# Patient Record
Sex: Male | Born: 2000 | Race: White | Hispanic: No | Marital: Single | Smoking: Never smoker
Health system: Southern US, Community
[De-identification: ages and names within clinical notes are randomized; demographics above are authoritative.]

---
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
## 2019-01-07 IMAGING — MR MR ANKLE*R* W/O CM
4 of 5 series · 23 of 40 positions shown · non-contrast
Comparison: 07/12/2016

CLINICAL DATA: Right lateral ankle pain. Twisting injury playing
soccer 2 months prior.

EXAM:
MRI OF THE RIGHT ANKLE WITHOUT CONTRAST
TECHNIQUE: Multiplanar, multisequence MR imaging of the ankle was performed. No
intravenous contrast was administered.

[Series 3: PD fat-sat · axial · 3.5mm · 0.31mm/px · z∈[-64,+58]mm · 9 of 29 slices shown]
[im 1/29]
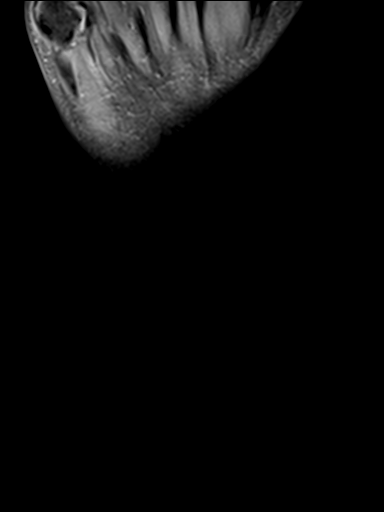
[im 4/29]
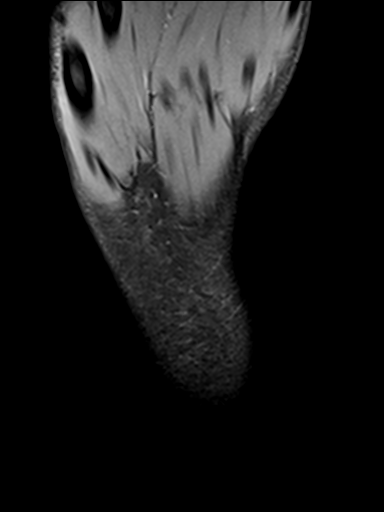
[im 8/29]
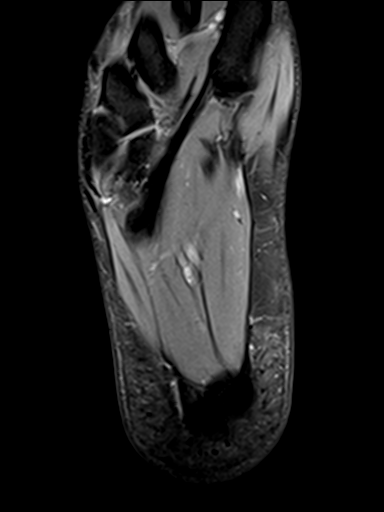
[im 11/29]
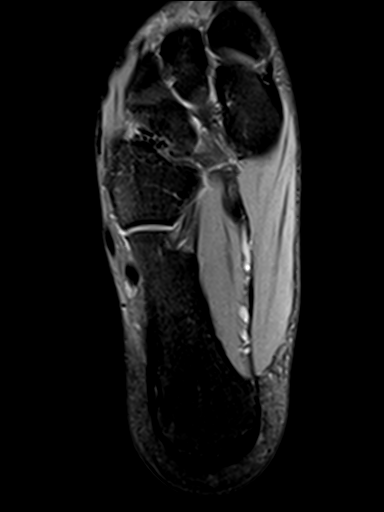
[im 15/29]
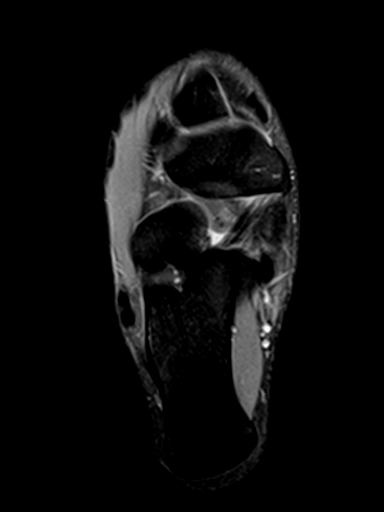
[im 18/29]
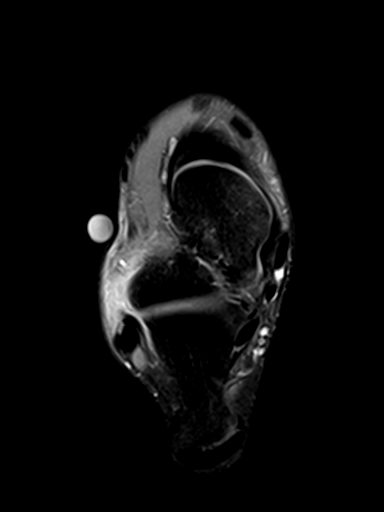
[im 22/29]
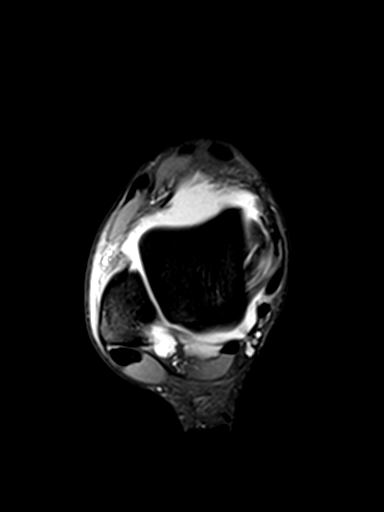
[im 25/29]
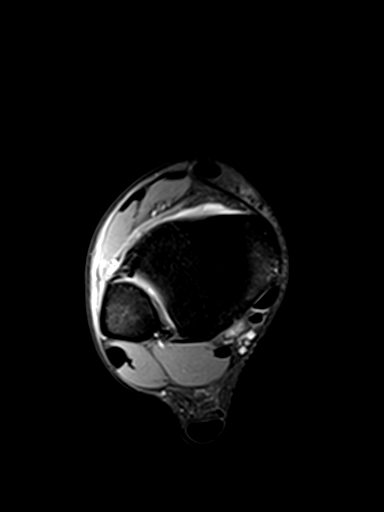
[im 29/29]
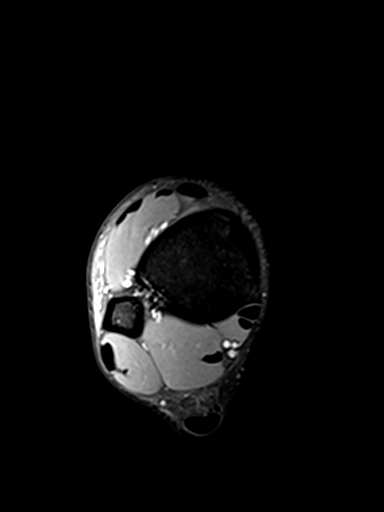

[Series 4: T2 fat-sat · axial · 3.5mm · 0.31mm/px · z∈[-64,+41]mm · 8 of 29 slices shown (1 of 3)]
[im 1/29]
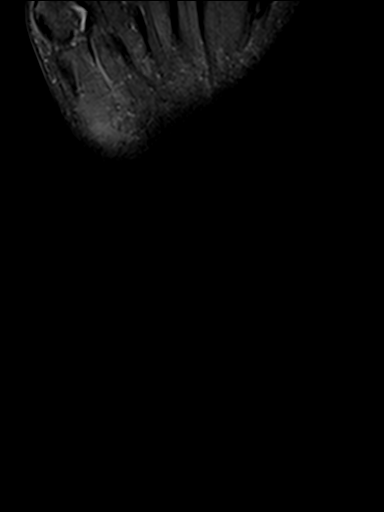
[im 4/29]
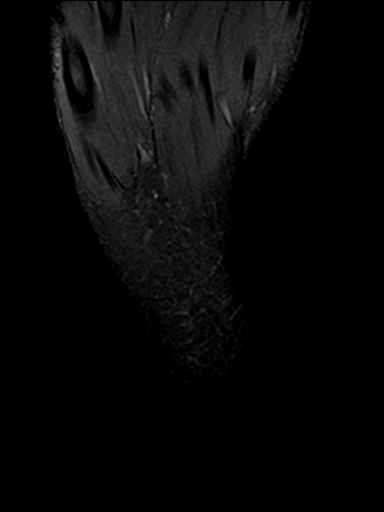
[im 8/29]
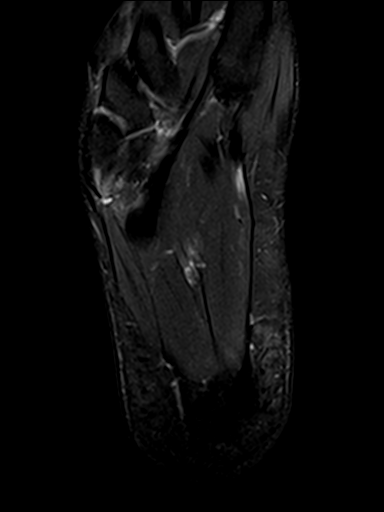
[im 11/29]
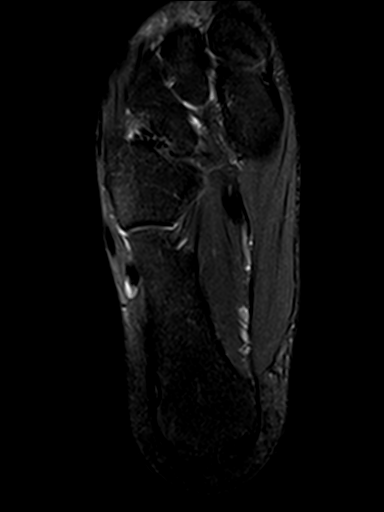
[im 15/29]
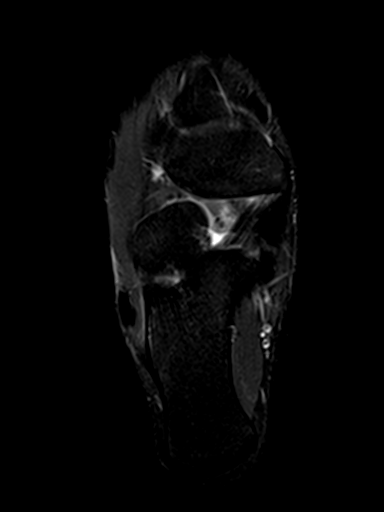
[im 18/29]
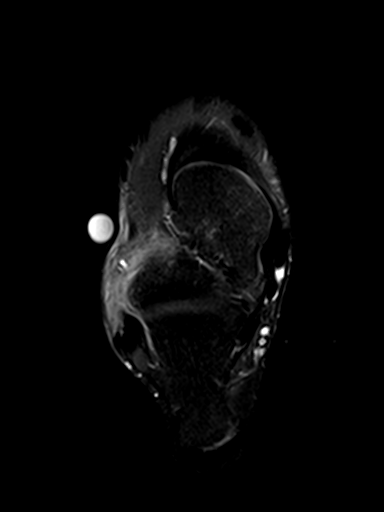
[im 22/29]
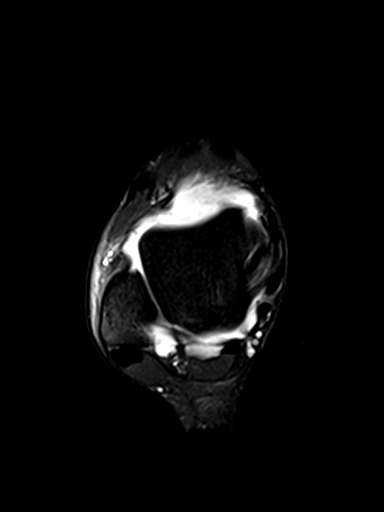
[im 25/29]
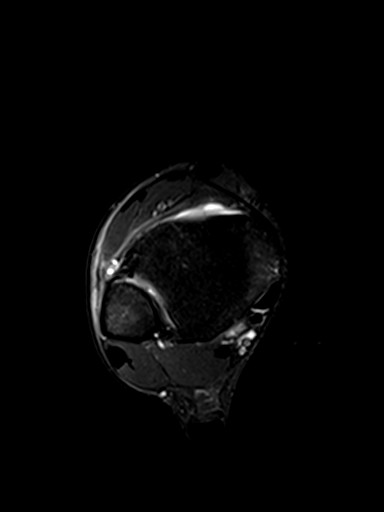

[Series 6: T2 fat-sat · sagittal · 3.0mm · 0.31mm/px · 3 of 20 slices shown (2 of 3)]
[im 4/20]
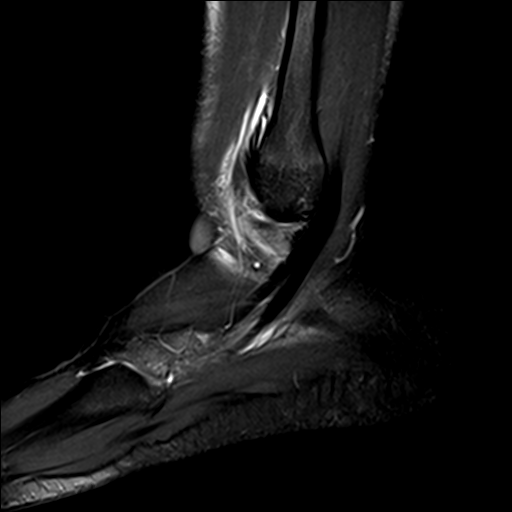
[im 12/20]
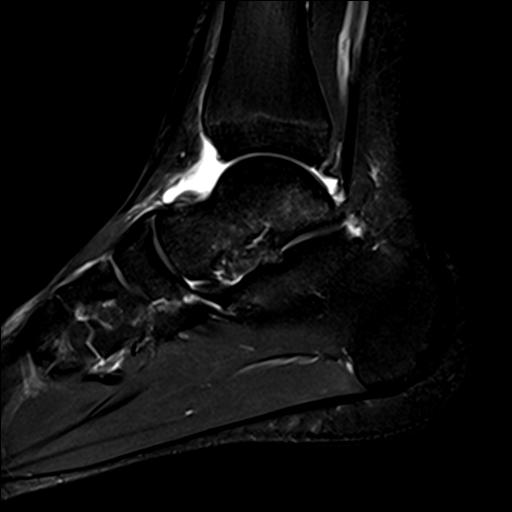
[im 20/20]
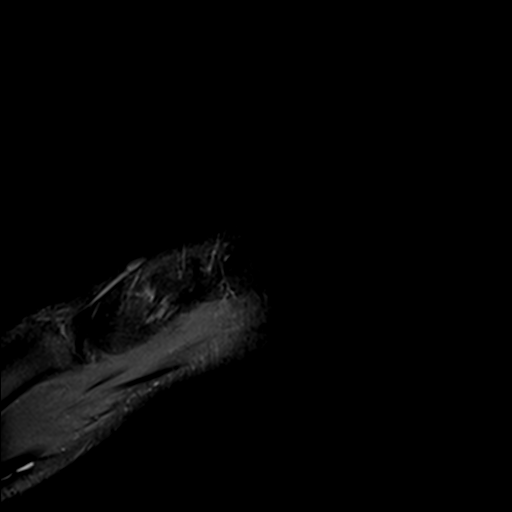

[Series 7: T2 fat-sat · coronal · 4.0mm · 0.31mm/px · 3 of 30 slices shown (3 of 3)]
[im 4/30]
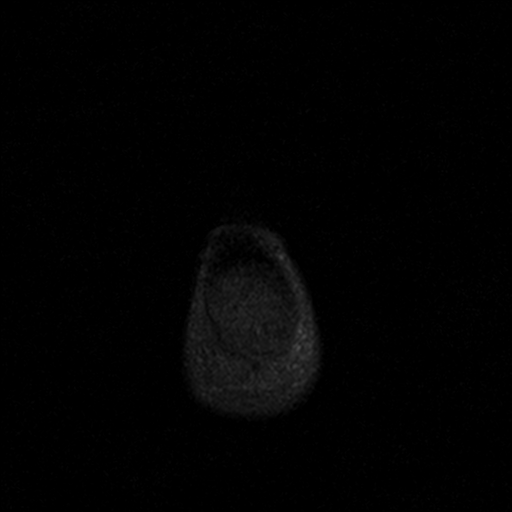
[im 15/30]
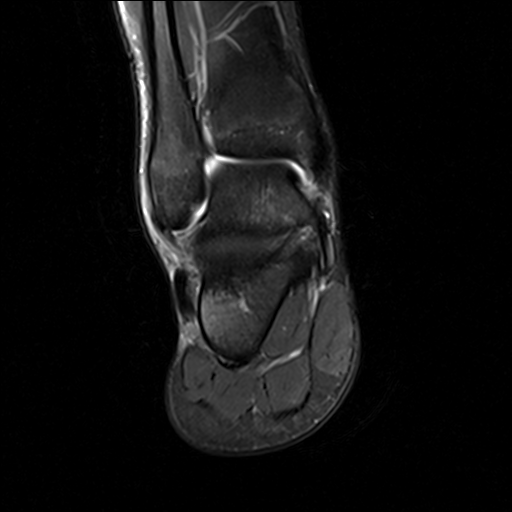
[im 26/30]
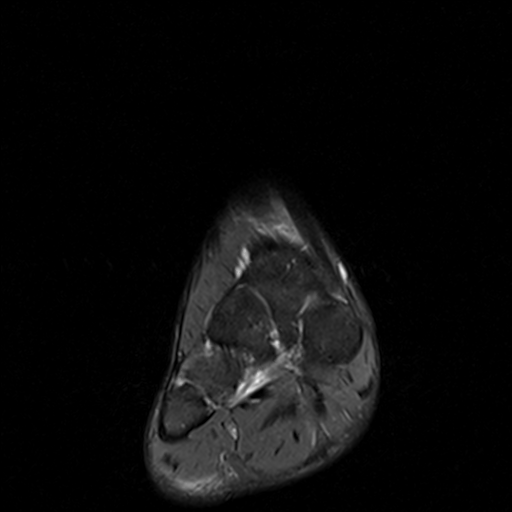

[23 of 40 positions shown; findings below may reference images not displayed]

FINDINGS: TENDONS

Peroneal: Mild distal peroneus longus tenosynovitis.

Posteromedial: Mild tibialis posterior tenosynovitis and distal
tendinopathy.

Anterior: Unremarkable

Achilles: Unremarkable

Plantar Fascia: Unremarkable

LIGAMENTS

Lateral: Torn anterior talofibular ligament with edema and soft
tissue prominence in this vicinity concerning for local synovitis
which can predispose to anterolateral impingement.

Medial: Low-level edema in the deep tibiotalar portion of the
deltoid ligament. Indistinctness of the inferoplantar longitudinal
portion of the spring ligament suspicious for potential tear.

CARTILAGE

Ankle Joint: Moderate tibiotalar joint effusion.

Subtalar Joints/Sinus Tarsi: Small posterior subtalar joint
effusion.

Bones: Low-level edema in the distal lateral cuboid, possibly from
stress reaction.

Other: No supplemental non-categorized findings.
IMPRESSION: 1. Torn anterior talofibular ligament with considerable soft tissue
prominence in the original vicinity of the ATFL compatible with
synovitis. This can cause anterolateral impingement. Considerable
subcutaneous edema adjacent to the torn ATFL.
2. Potential low-grade sprain of the deep tibiotalar portion of the
deltoid ligament.
3. Indistinct inferoplantar longitudinal portion of the spring
ligament, possibly torn.
4. Mild distal peroneus longus and tibialis posterior tenosynovitis.
Mild distal tibialis posterior tendinopathy, correlate clinically in
assessing for tibialis posterior dysfunction.
5. Potential mild stress reaction in the lateral cuboid.
6. Moderate tibiotalar joint effusion.
